# Patient Record
Sex: Male | Born: 1985 | Race: Black or African American | Hispanic: No | Marital: Single | Smoking: Current every day smoker
Health system: Southern US, Community
[De-identification: ages and names within clinical notes are randomized; demographics above are authoritative.]

---
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
## 2015-01-25 ENCOUNTER — Emergency Department (HOSPITAL_COMMUNITY): Payer: 59

## 2015-01-25 ENCOUNTER — Encounter (HOSPITAL_COMMUNITY): Payer: Self-pay | Admitting: Emergency Medicine

## 2015-01-25 ENCOUNTER — Emergency Department (HOSPITAL_COMMUNITY)
Admission: EM | Admit: 2015-01-25 | Discharge: 2015-01-25 | Disposition: A | Discharge status: Home / Self Care | Payer: 59 | Attending: Emergency Medicine | Admitting: Emergency Medicine

## 2015-01-25 DIAGNOSIS — L02214 Cutaneous abscess of groin: Secondary | ICD-10-CM

## 2015-01-25 LAB — CBC WITH DIFFERENTIAL/PLATELET
Basophils Absolute: 0 10*3/uL (ref 0.0–0.1)
Basophils Relative: 0 %
EOS ABS: 0.1 10*3/uL (ref 0.0–0.7)
EOS PCT: 1 %
HCT: 40.9 % (ref 39.0–52.0)
Hemoglobin: 14.3 g/dL (ref 13.0–17.0)
LYMPHS ABS: 2.2 10*3/uL (ref 0.7–4.0)
Lymphocytes Relative: 25 %
MCH: 30.7 pg (ref 26.0–34.0)
MCHC: 35 g/dL (ref 30.0–36.0)
MCV: 87.8 fL (ref 78.0–100.0)
MONO ABS: 1.2 10*3/uL — AB (ref 0.1–1.0)
Monocytes Relative: 13 %
Neutro Abs: 5.4 10*3/uL (ref 1.7–7.7)
Neutrophils Relative %: 61 %
PLATELETS: 234 10*3/uL (ref 150–400)
RBC: 4.66 MIL/uL (ref 4.22–5.81)
RDW: 13 % (ref 11.5–15.5)
WBC: 8.9 10*3/uL (ref 4.0–10.5)

## 2015-01-25 LAB — BASIC METABOLIC PANEL
Anion gap: 7 (ref 5–15)
CALCIUM: 8.9 mg/dL (ref 8.9–10.3)
CO2: 26 mmol/L (ref 22–32)
CREATININE: 0.8 mg/dL (ref 0.61–1.24)
Chloride: 100 mmol/L — ABNORMAL LOW (ref 101–111)
GFR calc Af Amer: >60 mL/min (ref >60)
GLUCOSE: 101 mg/dL — AB (ref 65–99)
POTASSIUM: 3.7 mmol/L (ref 3.5–5.1)
SODIUM: 133 mmol/L — AB (ref 135–145)

## 2015-01-25 MED ORDER — IOHEXOL 300 MG/ML  SOLN
75.0000 mL | Freq: Once | INTRAMUSCULAR | Status: AC | PRN
  Administered 2015-01-25: 75 mL via INTRAVENOUS

## 2015-01-25 MED ORDER — LIDOCAINE HCL (PF) 1 % IJ SOLN
INTRAMUSCULAR | Status: AC
  Administered 2015-01-25: 30 mL
  Filled 2015-01-25: qty 5

## 2015-01-25 MED ORDER — MORPHINE SULFATE (PF) 4 MG/ML IV SOLN
6.0000 mg | Freq: Once | INTRAVENOUS | Status: AC
  Administered 2015-01-25: 6 mg via INTRAVENOUS
  Filled 2015-01-25: qty 2

## 2015-01-25 MED ORDER — CLINDAMYCIN HCL 150 MG PO CAPS: 450.0000 mg | ORAL_CAPSULE | Freq: Three times a day (TID) | ORAL | Status: AC

## 2015-01-25 MED ORDER — LIDOCAINE HCL (PF) 1 % IJ SOLN
30.0000 mL | Freq: Once | INTRAMUSCULAR | Status: AC
  Administered 2015-01-25: 30 mL

## 2015-01-25 NOTE — Discharge Instructions (Signed)

## 2015-01-25 NOTE — ED Provider Notes (Signed)
CSN: [id number]     Arrival date & time 01/25/15  0420 History   First MD Initiated Contact with Patient 01/25/15 0515     Chief Complaint  Patient presents with  . Abscess     (Consider location/radiation/quality/duration/timing/severity/associated sxs/prior Treatment) Patient is a 29 y.o. male presenting with abscess.  Abscess Location:  Ano-genital Ano-genital abscess location:  Groin (left inguinal) Abscess quality: painful and redness   Abscess quality comment:  Swelling Duration:  5 days Progression:  Worsening Pain details:    Quality:  Aching   Severity:  Severe   Timing:  Constant   Progression:  Worsening Chronicity:  New Context comment:  Spontaneous Relieved by:  Nothing Exacerbated by: movement, touch, sitting. Ineffective treatments:  None tried Associated symptoms: no fever, no nausea and no vomiting     History reviewed. No pertinent past medical history. History reviewed. No pertinent past surgical history. No family history on file. Social History  Substance Use Topics  . Smoking status: None  . Smokeless tobacco: None  . Alcohol Use: None    Review of Systems  Constitutional: Negative for fever.  Gastrointestinal: Negative for nausea and vomiting.  All other systems reviewed and are negative.     Allergies  Review of patient's allergies indicates no known allergies.  Home Medications   Prior to Admission medications   Not on File   BP 142/94 mmHg  Pulse 94  Temp(Src) 99 F (37.2 C) (Oral)  Resp 18  Ht  (1.727 m)  Wt 145 lb (65.772 kg)  BMI 22.05 kg/m2  SpO2 97% Physical Exam  Constitutional: He is oriented to person, place, and time. He appears well-developed and well-nourished. No distress.  HENT:  Head: Normocephalic and atraumatic.  Eyes: Conjunctivae are normal. No scleral icterus.  Neck: Neck supple.  Cardiovascular: Normal rate and intact distal pulses.   Pulmonary/Chest: Effort normal. No stridor. No respiratory  distress.  Abdominal: Normal appearance. He exhibits no distension.  Genitourinary:     Neurological: He is alert and oriented to person, place, and time.  Skin: Skin is warm and dry. No rash noted.  Psychiatric: He has a normal mood and affect. His behavior is normal.  Nursing note and vitals reviewed.   ED Course  Procedures (including critical care time) Labs Review Labs Reviewed  CBC WITH DIFFERENTIAL/PLATELET - Abnormal; Notable for the following:    Monocytes Absolute 1.2 (*)    All other components within normal limits  BASIC METABOLIC PANEL - Abnormal; Notable for the following:    Sodium 133 (*)    Chloride 100 (*)    Glucose, Bld 101 (*)    BUN <5 (*)    All other components within normal limits    Imaging Review Ct Pelvis W Contrast  01/25/2015   CLINICAL DATA:  Inguinal mass  EXAM: CT PELVIS WITH CONTRAST  TECHNIQUE: Multidetector CT imaging of the pelvis was performed using the standard protocol following the bolus administration of intravenous contrast.  CONTRAST:  75 cc of Omnipaque 300  COMPARISON:  None.  FINDINGS: There is a complex hypodense collection within the superficial soft tissues of the left inguinal region, measuring 3.8 x 3.4 cm. There is mild inflammatory stranding within the surrounding soft tissues. There are also numerous surrounding lymph nodes, some normal in size and some enlarged, within the left inguinal region.  The collection appears to be confined to the superficial subcutaneous soft tissues. No deeper extension seen. There is certainly no extension into  the intraperitoneal or extraperitoneal pelvis.  Pelvic portion of the bowel is normal in caliber. No free fluid or abscess collection identified within the intraperitoneal pelvis. No acute osseous abnormality seen.  IMPRESSION: Complex, predominantly fluid-density, collection within the superficial subcutaneous soft tissues of the left inguinal region. The collection measures 3.8 x 3.4 cm and is  most likely abscess, less likely suppurative lymph node or necrotic soft tissue mass.  The presumed abscess collection appears to be confined to the superficial subcutaneous soft tissues. No extension into the deeper soft tissues of the left inguinal region. No extension into the intraperitoneal or extraperitoneal pelvis.  There is surrounding lymphadenopathy, presumably reactive in nature, and surrounding soft tissue inflammation.  Intrapelvic soft tissues are unremarkable.  These results were called by telephone at the time of interpretation on 01/25/2015 at 8:00 am to Dr. Blake Divine , who verbally acknowledged these results.   Electronically Signed   By: Bary Richard M.D.   On: 01/25/2015 08:06   I have personally reviewed and evaluated these images and lab results as part of my medical decision-making.   EKG Interpretation None      MDM   Final diagnoses:  Inguinal abscess    I was concerned that mass might be a hernia.  However, CT confirmed it was not.  Then it was I and D'd with very large amount of purulence drained.  Has surrounding cellulitis and lymphadenopathy.  Therefore, will treat with clinda and he will come back for a wound check.     Blake Divine, MD 01/25/15 [id number]

## 2015-01-25 NOTE — ED Notes (Signed)
Patient reports that he began experiencing swollen, reddened area near L groin area approx 5 days ago. Painful to touch. Denies nausea/vomiting.

## 2017-03-11 IMAGING — CT CT PELVIS W/ CM
2 of 3 series · 15 of 46 positions shown, 17 images · IV contrast (Omni 300)
Comparison: None.

CLINICAL DATA: Inguinal mass

EXAM:
CT PELVIS WITH CONTRAST
TECHNIQUE: Multidetector CT imaging of the pelvis was performed using the
standard protocol following the bolus administration of intravenous
contrast.
CONTRAST:  75 cc of Omnipaque 300

[Series 2: pelvis with 5.0 · axial · 0.51mm/px · z∈[+540,+776]mm · 12 of 55 slices shown, 14 images]
[im 4/55  soft-tissue]
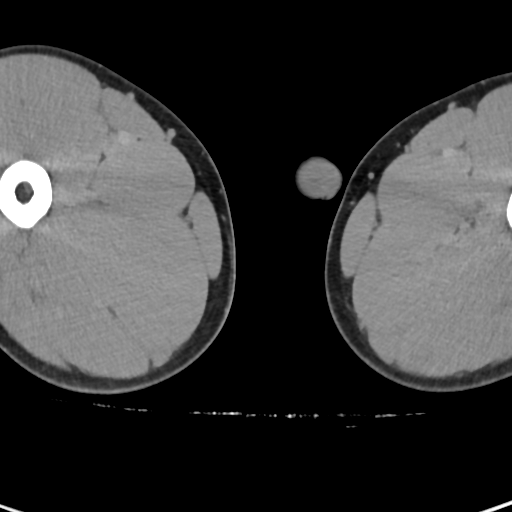
[im 4/55  bone]
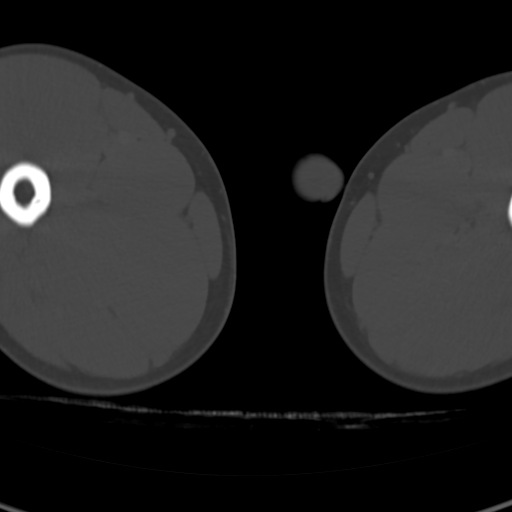
[im 7/55  soft-tissue]
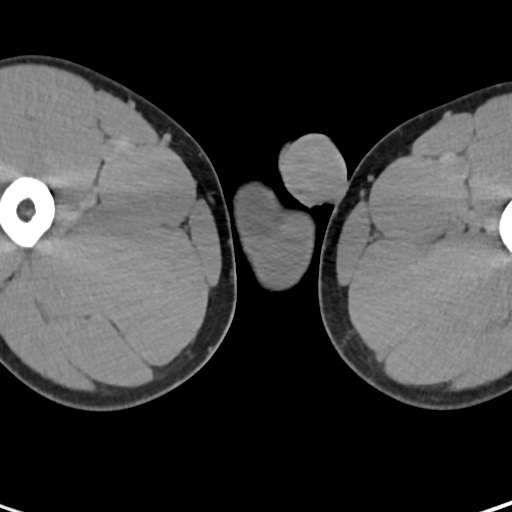
[im 13/55  soft-tissue]
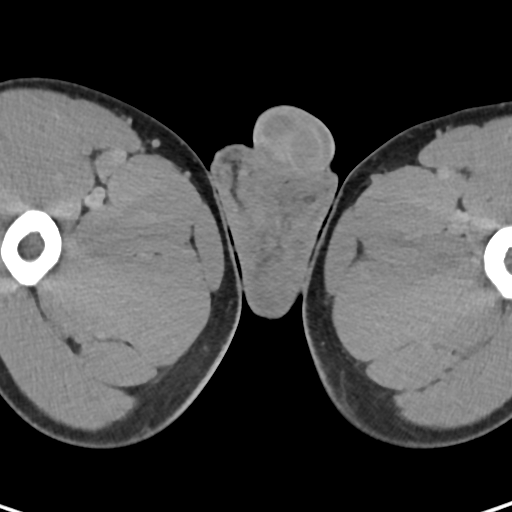
[im 16/55  soft-tissue]
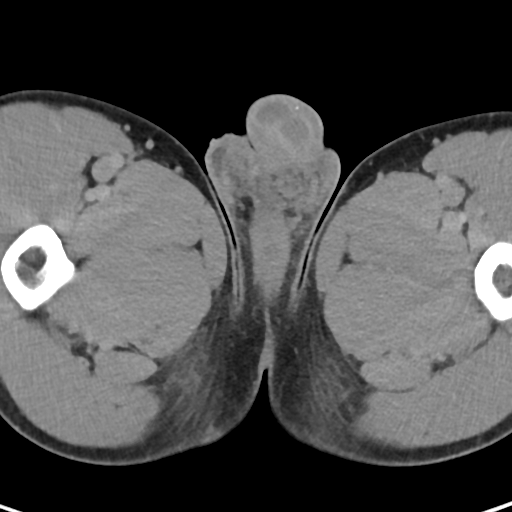
[im 21/55  soft-tissue]
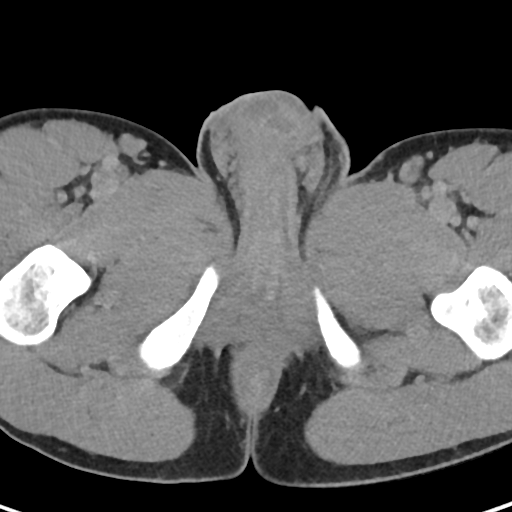
[im 25/55  soft-tissue]
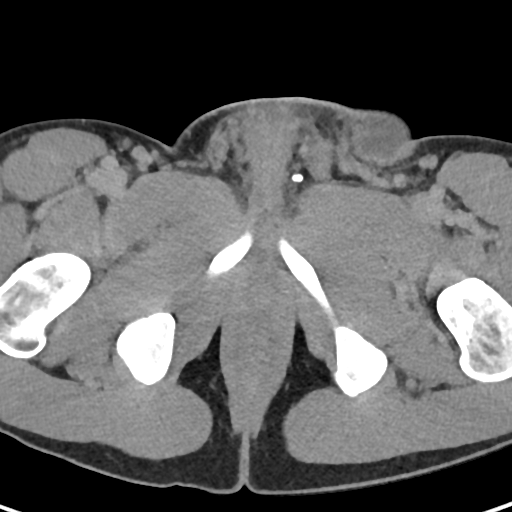
[im 30/55  soft-tissue]
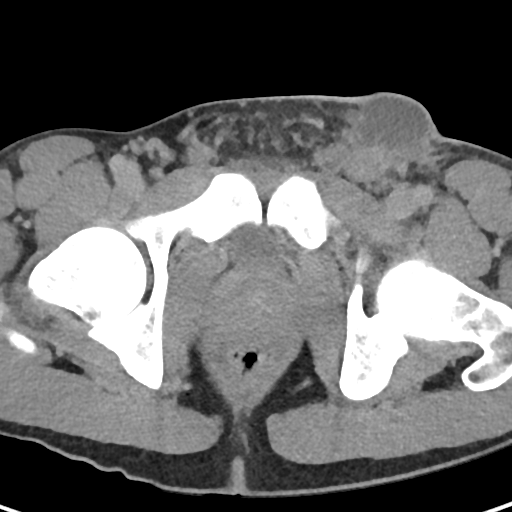
[im 34/55  soft-tissue]
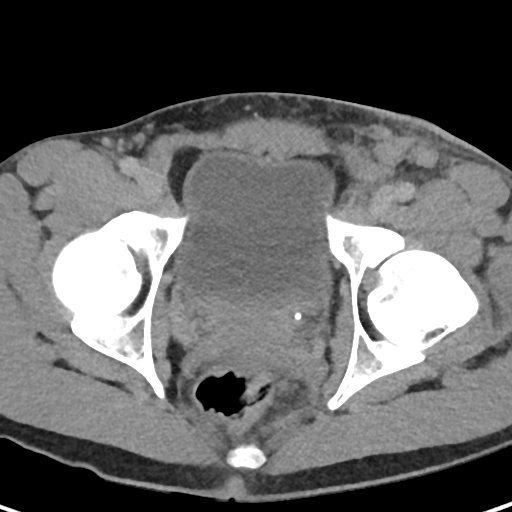
[im 39/55  soft-tissue]
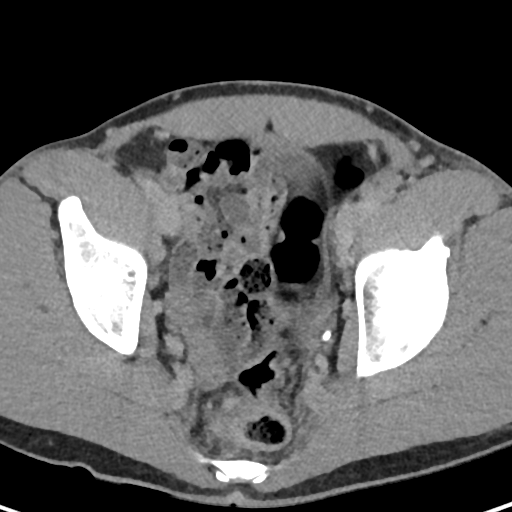
[im 39/55  bone]
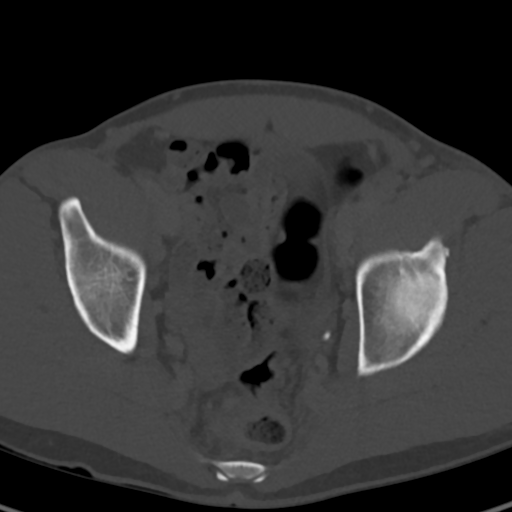
[im 42/55  soft-tissue]
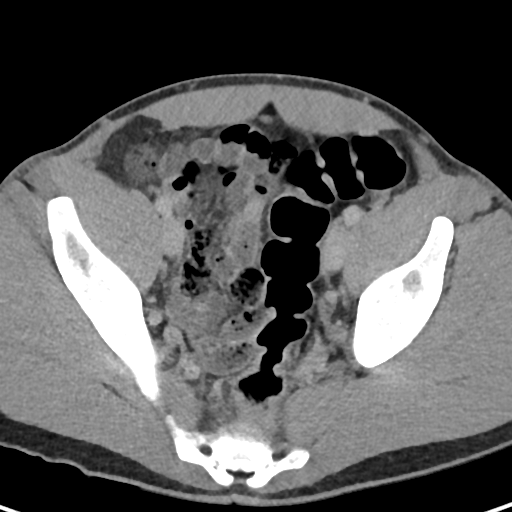
[im 48/55  soft-tissue]
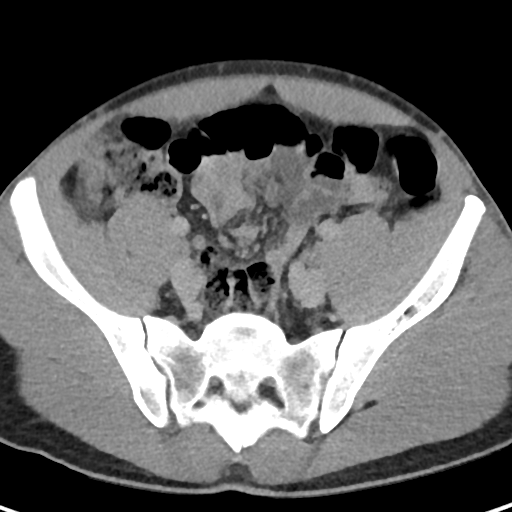
[im 51/55  soft-tissue]
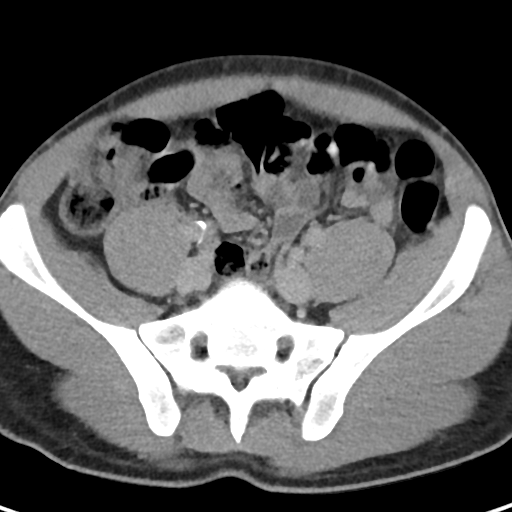

[Series 4: pelvis with 2.0 cor · coronal · 0.56mm/px · 3 of 101 slices shown]
[im 34/101  soft-tissue]
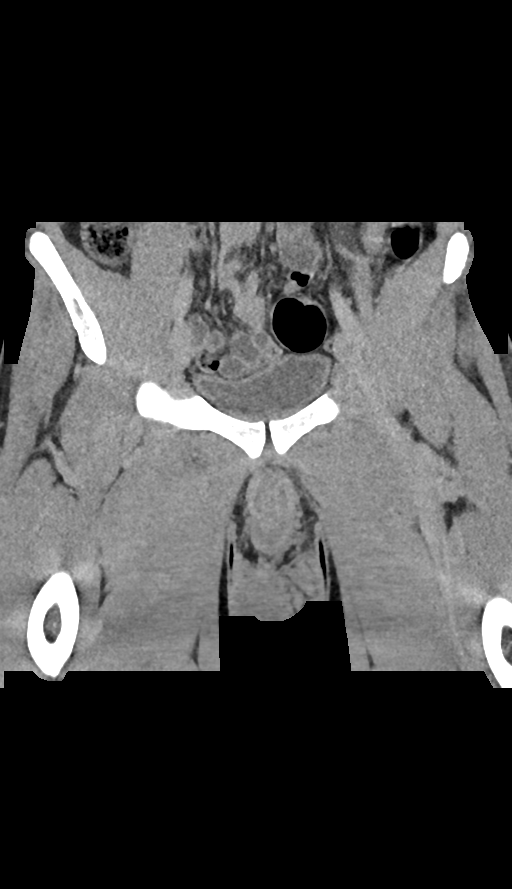
[im 45/101  soft-tissue]
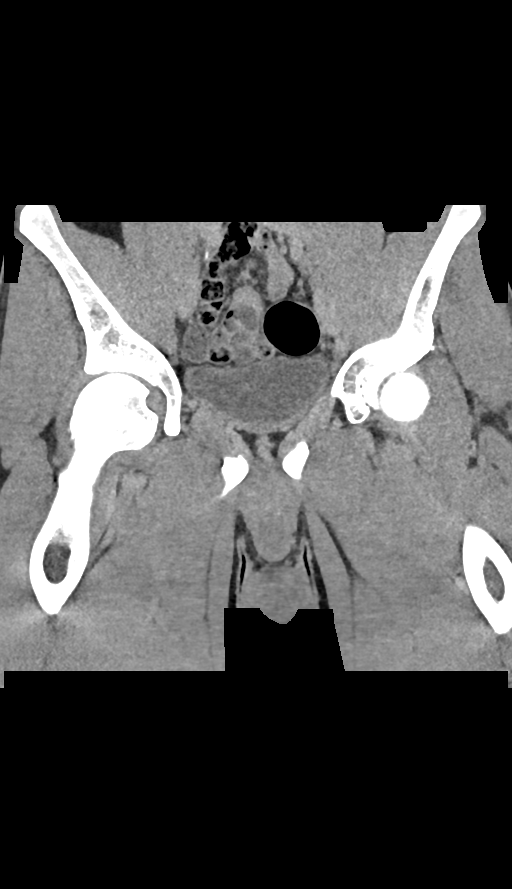
[im 56/101  soft-tissue]
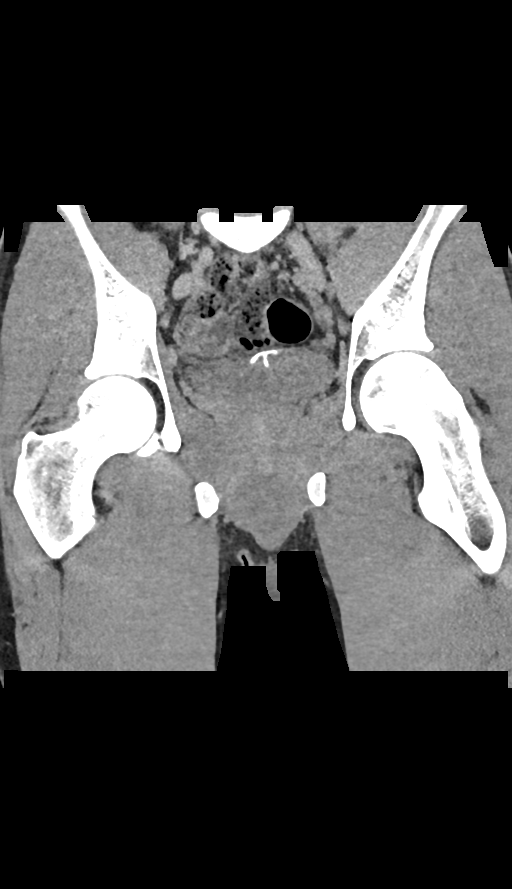

[15 of 46 positions shown; findings below may reference images not displayed]

FINDINGS: There is a complex hypodense collection within the superficial soft
tissues of the left inguinal region, measuring 3.8 x 3.4 cm. There
is mild inflammatory stranding within the surrounding soft tissues.
There are also numerous surrounding lymph nodes, some normal in size
and some enlarged, within the left inguinal region.

The collection appears to be confined to the superficial
subcutaneous soft tissues. No deeper extension seen. There is
certainly no extension into the intraperitoneal or extraperitoneal
pelvis.

Pelvic portion of the bowel is normal in caliber. No free fluid or
abscess collection identified within the intraperitoneal pelvis. No
acute osseous abnormality seen.
IMPRESSION: Complex, predominantly fluid-density, collection within the
superficial subcutaneous soft tissues of the left inguinal region.
The collection measures 3.8 x 3.4 cm and is most likely abscess,
less likely suppurative lymph node or necrotic soft tissue mass.

The presumed abscess collection appears to be confined to the
superficial subcutaneous soft tissues. No extension into the deeper
soft tissues of the left inguinal region. No extension into the
intraperitoneal or extraperitoneal pelvis.

There is surrounding lymphadenopathy, presumably reactive in nature,
and surrounding soft tissue inflammation.

Intrapelvic soft tissues are unremarkable.

These results were called by telephone at the time of interpretation
on 01/25/2015 at [DATE] to Dr. YOEL TIGER , who verbally
acknowledged these results.

## 2017-03-21 ENCOUNTER — Emergency Department (HOSPITAL_COMMUNITY)
Admission: EM | Admit: 2017-03-21 | Discharge: 2017-03-21 | Disposition: A | Discharge status: Home / Self Care | Payer: 59 | Attending: Emergency Medicine | Admitting: Emergency Medicine

## 2017-03-21 ENCOUNTER — Encounter (HOSPITAL_COMMUNITY): Payer: Self-pay | Admitting: Emergency Medicine

## 2017-03-21 DIAGNOSIS — R412 Retrograde amnesia: Secondary | ICD-10-CM | POA: Diagnosis not present

## 2017-03-21 DIAGNOSIS — F17210 Nicotine dependence, cigarettes, uncomplicated: Secondary | ICD-10-CM | POA: Diagnosis not present

## 2017-03-21 LAB — CBC WITH DIFFERENTIAL/PLATELET
Basophils Absolute: 0 10*3/uL (ref 0.0–0.1)
Basophils Relative: 0 %
EOS ABS: 0.1 10*3/uL (ref 0.0–0.7)
EOS PCT: 1 %
HCT: 44.9 % (ref 39.0–52.0)
HEMOGLOBIN: 15.2 g/dL (ref 13.0–17.0)
LYMPHS ABS: 2.1 10*3/uL (ref 0.7–4.0)
LYMPHS PCT: 28 %
MCH: 29.9 pg (ref 26.0–34.0)
MCHC: 33.9 g/dL (ref 30.0–36.0)
MCV: 88.4 fL (ref 78.0–100.0)
Monocytes Absolute: 0.7 10*3/uL (ref 0.1–1.0)
Monocytes Relative: 9 %
NEUTROS PCT: 62 %
Neutro Abs: 4.8 10*3/uL (ref 1.7–7.7)
PLATELETS: 284 10*3/uL (ref 150–400)
RBC: 5.08 MIL/uL (ref 4.22–5.81)
RDW: 13.1 % (ref 11.5–15.5)
WBC: 7.8 10*3/uL (ref 4.0–10.5)

## 2017-03-21 LAB — COMPREHENSIVE METABOLIC PANEL
ALT: 21 U/L (ref 17–63)
ANION GAP: 6 (ref 5–15)
AST: 25 U/L (ref 15–41)
Albumin: 4.1 g/dL (ref 3.5–5.0)
Alkaline Phosphatase: 55 U/L (ref 38–126)
BUN: 11 mg/dL (ref 6–20)
CHLORIDE: 102 mmol/L (ref 101–111)
CO2: 25 mmol/L (ref 22–32)
Calcium: 9 mg/dL (ref 8.9–10.3)
Creatinine, Ser: 0.71 mg/dL (ref 0.61–1.24)
Glucose, Bld: 88 mg/dL (ref 65–99)
POTASSIUM: 4.3 mmol/L (ref 3.5–5.1)
Sodium: 133 mmol/L — ABNORMAL LOW (ref 135–145)
Total Bilirubin: 1.2 mg/dL (ref 0.3–1.2)
Total Protein: 8.4 g/dL — ABNORMAL HIGH (ref 6.5–8.1)

## 2017-03-21 LAB — RAPID URINE DRUG SCREEN, HOSP PERFORMED
AMPHETAMINES: POSITIVE — AB
BENZODIAZEPINES: NOT DETECTED
Barbiturates: NOT DETECTED
Cocaine: NOT DETECTED
OPIATES: NOT DETECTED
TETRAHYDROCANNABINOL: NOT DETECTED

## 2017-03-21 NOTE — Discharge Instructions (Signed)
Please follow up as needed. Your drug screen is positive for amphetamines. Otherwise labs unremarkable.
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
## 2017-08-15 ENCOUNTER — Emergency Department (HOSPITAL_COMMUNITY)
Admission: EM | Admit: 2017-08-15 | Discharge: 2017-08-15 | Disposition: A | Discharge status: Home / Self Care | Payer: 59 | Attending: Emergency Medicine | Admitting: Emergency Medicine

## 2017-08-15 ENCOUNTER — Other Ambulatory Visit: Payer: Self-pay

## 2017-08-15 ENCOUNTER — Encounter (HOSPITAL_COMMUNITY): Payer: Self-pay | Admitting: Emergency Medicine

## 2017-08-15 DIAGNOSIS — B20 Human immunodeficiency virus [HIV] disease: Secondary | ICD-10-CM | POA: Insufficient documentation

## 2017-08-15 DIAGNOSIS — K648 Other hemorrhoids: Secondary | ICD-10-CM

## 2017-08-15 DIAGNOSIS — F17210 Nicotine dependence, cigarettes, uncomplicated: Secondary | ICD-10-CM | POA: Insufficient documentation

## 2017-08-15 DIAGNOSIS — K644 Residual hemorrhoidal skin tags: Secondary | ICD-10-CM | POA: Insufficient documentation

## 2017-08-15 DIAGNOSIS — K6289 Other specified diseases of anus and rectum: Secondary | ICD-10-CM

## 2017-08-15 LAB — CBC
HCT: 40.1 % (ref 39.0–52.0)
Hemoglobin: 13.3 g/dL (ref 13.0–17.0)
MCH: 29 pg (ref 26.0–34.0)
MCHC: 33.2 g/dL (ref 30.0–36.0)
MCV: 87.6 fL (ref 78.0–100.0)
PLATELETS: 344 10*3/uL (ref 150–400)
RBC: 4.58 MIL/uL (ref 4.22–5.81)
RDW: 12.8 % (ref 11.5–15.5)
WBC: 8.2 10*3/uL (ref 4.0–10.5)

## 2017-08-15 LAB — COMPREHENSIVE METABOLIC PANEL
ALBUMIN: 3.3 g/dL — AB (ref 3.5–5.0)
ALK PHOS: 61 U/L (ref 38–126)
ALT: 10 U/L — AB (ref 17–63)
AST: 15 U/L (ref 15–41)
Anion gap: 7 (ref 5–15)
BUN: 7 mg/dL (ref 6–20)
CALCIUM: 8.8 mg/dL — AB (ref 8.9–10.3)
CHLORIDE: 102 mmol/L (ref 101–111)
CO2: 26 mmol/L (ref 22–32)
Creatinine, Ser: 0.73 mg/dL (ref 0.61–1.24)
GFR calc non Af Amer: >60 mL/min (ref >60)
Glucose, Bld: 93 mg/dL (ref 65–99)
Potassium: 3.7 mmol/L (ref 3.5–5.1)
SODIUM: 135 mmol/L (ref 135–145)
Total Bilirubin: 0.4 mg/dL (ref 0.3–1.2)
Total Protein: 9.5 g/dL — ABNORMAL HIGH (ref 6.5–8.1)

## 2017-08-15 LAB — POC OCCULT BLOOD, ED: Fecal Occult Bld: POSITIVE — AB

## 2017-08-15 LAB — TYPE AND SCREEN
ABO/RH(D): B NEG
Antibody Screen: NEGATIVE

## 2017-08-15 LAB — ABO/RH: ABO/RH(D): B NEG

## 2017-08-15 MED ORDER — POLYETHYLENE GLYCOL 3350 17 GM/SCOOP PO POWD: ORAL | 0 refills | Status: AC

## 2017-08-15 NOTE — Discharge Instructions (Signed)
Increase hydration and follow a high fiber diet. Use a stool softener such as Colace, DulcoLax, or MiraLax to help soften stool and relieve pain with bowel movements. Sitz baths (ask pharmacist if you need help finding this) and Epsom salt baths will also help improve symptoms. Apply topical medications to hemorrhoid twice daily. If symptoms persist longer than 1-2 weeks, please follow up with your primary care provider. Please return to ER for new or worsening symptoms, any additional concerns.  ° °

## 2017-08-15 NOTE — ED Triage Notes (Signed)
Pt complaint of trouble having BM for two weeks; bright red blood noted in toilet and with wiping.

## 2017-08-15 NOTE — ED Provider Notes (Signed)
[facility]EMER[facility]ENCY DEPT Provider Note  CSN: [id number] Arrival date & time: 08/15/17 1317  Chief Complaint(s) Constipation and Rectal Bleeding  HPI Carlos Bonilla is a 32 y.o. male   The history is provided by the patient.  Rectal Bleeding  Quality:  Bright red Amount:  Scant Duration:  2 weeks Timing:  Intermittent Chronicity:  New Context: constipation, hemorrhoids and rectal pain   Pain details:    Quality:  Aching   Severity:  Moderate   Duration:  2 weeks   Timing:  Worse with defecation   Progression:  Waxing and waning Relieved by:  Nothing Worsened by:  Nothing Associated symptoms: no abdominal pain, no fever, no hematemesis, no light-headedness, no loss of consciousness, no recent illness and no vomiting    Pt has M o M sexual intercourse. H/o HIV not on HAART meds for 2 months. Last anal intercourse 4 months ago. No prior anal STI, but does endorse prior penile STD.  Past Medical History History reviewed. No pertinent past medical history. There are no active problems to display for this patient.  Home Medication(s) Prior to Admission medications   Medication Sig Start Date End Date Taking? Authorizing Provider  naproxen sodium (ALEVE) 220 MG tablet Take 220 mg by mouth 2 (two) times daily as needed (pain).   Yes [provider]  clindamycin (CLEOCIN) 150 MG capsule Take 3 capsules (450 mg total) by mouth 3 (three) times daily. Patient not taking: Reported on 03/21/2017 01/25/15   Blake Divine, MD  polyethylene glycol powder [facility]) powder Take 3 capfuls on days 1 and 2. On day 3 start taking 1 capful 3 times a day. Slowly cut back as needed until you have normal bowel movements 08/15/17   Janijah Symons, Amadeo Garnet, MD                                                                                                                                    Past Surgical History History reviewed. No pertinent surgical history. Family  History No family history on file.  Social History Social History   Tobacco Use  . Smoking status: Current Every Day Smoker    Types: Cigarettes  . Smokeless tobacco: Never Used  Substance Use Topics  . Alcohol use: Yes  . Drug use: Yes    Types: Marijuana   Allergies Patient has no known allergies.  Review of Systems Review of Systems  Constitutional: Negative for fever.  Gastrointestinal: Positive for hematochezia. Negative for abdominal pain, hematemesis and vomiting.  Neurological: Negative for loss of consciousness and light-headedness.   All other systems are reviewed and are negative for acute change except as noted in the HPI  Physical Exam Vital Signs  I have reviewed the triage vital signs BP 123/75 (BP Location: Right Arm)   Pulse 86   Temp 98.2 F (36.8 C) (Oral)   Resp 18   SpO2 100%  Physical Exam  Constitutional: He is oriented to person, place, and time. He appears well-developed and well-nourished. No distress.  HENT:  Head: Normocephalic and atraumatic.  Right Ear: External ear normal.  Left Ear: External ear normal.  Nose: Nose normal.  Mouth/Throat: Mucous membranes are normal. No trismus in the jaw.  Eyes: Conjunctivae and EOM are normal. No scleral icterus.  Neck: Normal range of motion and phonation normal.  Cardiovascular: Normal rate and regular rhythm.  Pulmonary/Chest: Effort normal. No stridor. No respiratory distress.  Abdominal: He exhibits no distension. There is no tenderness. There is no rigidity, no rebound and negative Murphy's sign.  Genitourinary: Rectal exam shows external hemorrhoid and internal hemorrhoid. Prostate is not enlarged and not tender.  Genitourinary Comments: Chaperone present during pelvic exam.   Musculoskeletal: Normal range of motion. He exhibits no edema.  Neurological: He is alert and oriented to person, place, and time.  Skin: He is not diaphoretic.  Psychiatric: He has a normal mood and affect. His  behavior is normal.  Vitals reviewed.   ED Results and Treatments Labs (all labs ordered are listed, but only abnormal results are displayed) Labs Reviewed  COMPREHENSIVE METABOLIC PANEL - Abnormal; Notable for the following components:      Result Value   Calcium 8.8 (*)    Total Protein 9.5 (*)    Albumin 3.3 (*)    ALT 10 (*)    All other components within normal limits  POC OCCULT BLOOD, ED - Abnormal; Notable for the following components:   Fecal Occult Bld POSITIVE (*)    All other components within normal limits  CBC  TYPE AND SCREEN  ABO/RH  GC/CHLAMYDIA PROBE AMP ([facility]) NOT AT [facility]                                                                                                                         EKG  EKG Interpretation  Date/Time:    Ventricular Rate:    PR Interval:    QRS Duration:   QT Interval:    QTC Calculation:   R Axis:     Text Interpretation:        Radiology No results found. Pertinent labs & imaging results that were available during my care of the patient were reviewed by me and considered in my medical decision making (see chart for details).  Medications Ordered in ED Medications - No data to display  Procedures LOWER ENDOSCOPY Date/Time: 08/15/2017 6:58 PM Performed by: Nira Conn, MD Authorized by: Nira Conn, MD  Consent: Verbal consent obtained. Consent given by: patient Patient understanding: patient states understanding of the procedure being performed Patient identity confirmed: verbally with patient Time out: Immediately prior to procedure a "time out" was called to verify the correct patient, procedure, equipment, support staff and site/side marked as required. Indications: constipation, hemorrhoids, rectal bleeding and rectal pain  Sedation: Patient sedated:  no  Scope type: anoscope External exam performed: yes Positive external exam findings: external hemorrhoids External hemorrhoid position: five o'clock, six o'clock and four o'clock Digital exam performed: yes Negative digital exam findings: no laxity of anal sphincter, no prostate tenderness and no prostate enlargement Positive internal exam findings: internal hemorrhoid Internal hemorrhoid position: four o'clock, five o'clock, six o'clock and three o'clock Comments: Bleeding internal hemorrhoids noted. No blood from above. No purulence noted. No abscess or fistula visualized.      (including critical care time)  Medical Decision Making / ED Course I have reviewed the nursing notes for this encounter and the patient's prior records (if available in EHR or on provided paperwork).    Patient is here with several weeks of constipation rectal pain with associated hematochezia.  In exam patient has both internal and external bleeding hemorrhoids.  No blood from above on anoscopy.  Patient does endorse anal intercourse last of which was 4 months ago.  Denies any anal STDs but does report prior penile STDs.  GC/chlamydia was obtained.  Bleeding is secondary to hemorrhoidal bleeding which is due to constipation.    Recommended stool softeners and sitz baths.  Given the fact that the patient is not on antiretroviral for his HIV, he was instructed to follow-up with [place] and wellness so he can get established with a primary care provider and have close follow-up to establish with infectious disease.  The patient appears reasonably screened and/or stabilized for discharge and I doubt any other medical condition or other [facility] requiring further screening, evaluation, or treatment in the ED at this time prior to discharge.  The patient is safe for discharge with strict return precautions.   Final Clinical Impression(s) / ED Diagnoses Final diagnoses:  Internal hemorrhoid, bleeding   External hemorrhoid, bleeding  Rectal pain    Disposition: Discharge  Condition: Good  I have discussed the results, Dx and Tx plan with the patient who expressed understanding and agree(s) with the plan. Discharge instructions discussed at great length. The patient was given strict return precautions who verbalized understanding of the instructions. No further questions at time of discharge.    ED Discharge Orders        Ordered    polyethylene glycol powder (MIRALAX) powder     08/15/17 1853       Follow Up: [facility] [address] [place] [address] [phone] Schedule an appointment as soon as possible for a visit  For close follow up     This chart was dictated using voice recognition software.  Despite best efforts to proofread,  errors can occur which can change the documentation meaning.   Nira Conn, MD 08/15/17 1901

## 2017-08-16 LAB — GC/CHLAMYDIA PROBE AMP (~~LOC~~) NOT AT ARMC
CHLAMYDIA, DNA PROBE: POSITIVE — AB
Neisseria Gonorrhea: NEGATIVE
